# Patient Record
Sex: Female | Born: 1960 | Race: Black or African American | Hispanic: No | Marital: Single | Smoking: Never smoker
Health system: Southern US, Community
[De-identification: ages and names within clinical notes are randomized; demographics above are authoritative.]

---

## 2004-08-20 ENCOUNTER — Ambulatory Visit: Payer: Self-pay | Admitting: Obstetrics and Gynecology

## 2005-06-15 ENCOUNTER — Emergency Department: Payer: Self-pay | Admitting: Emergency Medicine

## 2005-08-22 ENCOUNTER — Ambulatory Visit: Payer: Self-pay | Admitting: Obstetrics and Gynecology

## 2006-09-09 ENCOUNTER — Ambulatory Visit: Payer: Self-pay | Admitting: Obstetrics and Gynecology

## 2008-09-21 ENCOUNTER — Ambulatory Visit: Payer: Self-pay | Admitting: Obstetrics and Gynecology

## 2008-10-12 ENCOUNTER — Ambulatory Visit: Payer: Self-pay | Admitting: Obstetrics and Gynecology

## 2010-05-23 ENCOUNTER — Ambulatory Visit: Payer: Self-pay | Admitting: Obstetrics and Gynecology

## 2010-06-15 ENCOUNTER — Ambulatory Visit: Payer: Self-pay | Admitting: Surgery

## 2010-06-22 ENCOUNTER — Ambulatory Visit: Payer: Self-pay | Admitting: Surgery

## 2010-06-26 LAB — PATHOLOGY REPORT

## 2010-09-11 ENCOUNTER — Ambulatory Visit: Payer: Self-pay | Admitting: Gastroenterology

## 2012-03-18 ENCOUNTER — Ambulatory Visit: Payer: Self-pay | Admitting: Obstetrics and Gynecology

## 2015-10-30 ENCOUNTER — Emergency Department (HOSPITAL_COMMUNITY)
Admission: EM | Admit: 2015-10-30 | Discharge: 2015-10-30 | Disposition: A | Discharge status: Home / Self Care | Payer: Self-pay

## 2015-11-02 ENCOUNTER — Other Ambulatory Visit: Payer: Self-pay | Admitting: Family Medicine

## 2015-11-02 ENCOUNTER — Other Ambulatory Visit: Payer: Self-pay

## 2015-11-02 DIAGNOSIS — R109 Unspecified abdominal pain: Secondary | ICD-10-CM

## 2015-11-03 ENCOUNTER — Other Ambulatory Visit: Payer: Self-pay

## 2015-11-03 ENCOUNTER — Ambulatory Visit
Admission: RE | Admit: 2015-11-03 | Discharge: 2015-11-03 | Disposition: A | Discharge status: Home / Self Care | Payer: Federal, State, Local not specified - PPO | Source: Ambulatory Visit | Attending: Family Medicine | Admitting: Family Medicine

## 2015-11-03 DIAGNOSIS — R109 Unspecified abdominal pain: Secondary | ICD-10-CM

## 2015-11-07 ENCOUNTER — Other Ambulatory Visit: Payer: Self-pay | Admitting: Family Medicine

## 2015-11-07 DIAGNOSIS — N2889 Other specified disorders of kidney and ureter: Secondary | ICD-10-CM

## 2015-11-10 ENCOUNTER — Ambulatory Visit
Admission: RE | Admit: 2015-11-10 | Discharge: 2015-11-10 | Disposition: A | Discharge status: Home / Self Care | Payer: Federal, State, Local not specified - PPO | Source: Ambulatory Visit | Attending: Family Medicine | Admitting: Family Medicine

## 2015-11-10 DIAGNOSIS — N2889 Other specified disorders of kidney and ureter: Secondary | ICD-10-CM

## 2016-01-04 DIAGNOSIS — M545 Low back pain: Secondary | ICD-10-CM | POA: Diagnosis not present

## 2016-01-17 DIAGNOSIS — M545 Low back pain: Secondary | ICD-10-CM | POA: Diagnosis not present

## 2016-03-12 DIAGNOSIS — M9902 Segmental and somatic dysfunction of thoracic region: Secondary | ICD-10-CM | POA: Diagnosis not present

## 2016-03-12 DIAGNOSIS — M9903 Segmental and somatic dysfunction of lumbar region: Secondary | ICD-10-CM | POA: Diagnosis not present

## 2016-03-12 DIAGNOSIS — M9901 Segmental and somatic dysfunction of cervical region: Secondary | ICD-10-CM | POA: Diagnosis not present

## 2016-03-12 DIAGNOSIS — M545 Low back pain: Secondary | ICD-10-CM | POA: Diagnosis not present

## 2016-03-13 DIAGNOSIS — M9901 Segmental and somatic dysfunction of cervical region: Secondary | ICD-10-CM | POA: Diagnosis not present

## 2016-03-13 DIAGNOSIS — M9903 Segmental and somatic dysfunction of lumbar region: Secondary | ICD-10-CM | POA: Diagnosis not present

## 2016-03-13 DIAGNOSIS — M9902 Segmental and somatic dysfunction of thoracic region: Secondary | ICD-10-CM | POA: Diagnosis not present

## 2016-03-13 DIAGNOSIS — M545 Low back pain: Secondary | ICD-10-CM | POA: Diagnosis not present

## 2016-09-25 DIAGNOSIS — Z803 Family history of malignant neoplasm of breast: Secondary | ICD-10-CM | POA: Diagnosis not present

## 2016-09-25 DIAGNOSIS — Z1231 Encounter for screening mammogram for malignant neoplasm of breast: Secondary | ICD-10-CM | POA: Diagnosis not present

## 2016-11-04 ENCOUNTER — Other Ambulatory Visit (HOSPITAL_COMMUNITY)
Admission: RE | Admit: 2016-11-04 | Discharge: 2016-11-04 | Disposition: A | Discharge status: Home / Self Care | Payer: Federal, State, Local not specified - PPO | Source: Ambulatory Visit | Attending: Family Medicine | Admitting: Family Medicine

## 2016-11-04 ENCOUNTER — Other Ambulatory Visit: Payer: Self-pay | Admitting: Family Medicine

## 2016-11-04 DIAGNOSIS — Z0000 Encounter for general adult medical examination without abnormal findings: Secondary | ICD-10-CM | POA: Diagnosis not present

## 2016-11-04 DIAGNOSIS — Z01411 Encounter for gynecological examination (general) (routine) with abnormal findings: Secondary | ICD-10-CM | POA: Insufficient documentation

## 2016-11-04 DIAGNOSIS — E782 Mixed hyperlipidemia: Secondary | ICD-10-CM | POA: Diagnosis not present

## 2016-11-04 DIAGNOSIS — E876 Hypokalemia: Secondary | ICD-10-CM | POA: Diagnosis not present

## 2016-11-04 DIAGNOSIS — R399 Unspecified symptoms and signs involving the genitourinary system: Secondary | ICD-10-CM | POA: Diagnosis not present

## 2016-11-04 DIAGNOSIS — Z1151 Encounter for screening for human papillomavirus (HPV): Secondary | ICD-10-CM | POA: Diagnosis not present

## 2016-11-04 DIAGNOSIS — D649 Anemia, unspecified: Secondary | ICD-10-CM | POA: Diagnosis not present

## 2016-11-04 DIAGNOSIS — E559 Vitamin D deficiency, unspecified: Secondary | ICD-10-CM | POA: Diagnosis not present

## 2016-11-06 LAB — CYTOLOGY - PAP
Diagnosis: NEGATIVE
HPV: NOT DETECTED

## 2017-11-05 DIAGNOSIS — D649 Anemia, unspecified: Secondary | ICD-10-CM | POA: Diagnosis not present

## 2017-11-05 DIAGNOSIS — E785 Hyperlipidemia, unspecified: Secondary | ICD-10-CM | POA: Diagnosis not present

## 2017-11-05 DIAGNOSIS — E559 Vitamin D deficiency, unspecified: Secondary | ICD-10-CM | POA: Diagnosis not present

## 2017-11-05 DIAGNOSIS — Z0000 Encounter for general adult medical examination without abnormal findings: Secondary | ICD-10-CM | POA: Diagnosis not present

## 2017-11-05 DIAGNOSIS — Z5181 Encounter for therapeutic drug level monitoring: Secondary | ICD-10-CM | POA: Diagnosis not present

## 2018-01-16 DIAGNOSIS — Z1231 Encounter for screening mammogram for malignant neoplasm of breast: Secondary | ICD-10-CM | POA: Diagnosis not present

## 2018-11-27 DIAGNOSIS — Z5181 Encounter for therapeutic drug level monitoring: Secondary | ICD-10-CM | POA: Diagnosis not present

## 2018-11-27 DIAGNOSIS — Z0000 Encounter for general adult medical examination without abnormal findings: Secondary | ICD-10-CM | POA: Diagnosis not present

## 2018-11-27 DIAGNOSIS — D649 Anemia, unspecified: Secondary | ICD-10-CM | POA: Diagnosis not present

## 2018-11-27 DIAGNOSIS — E559 Vitamin D deficiency, unspecified: Secondary | ICD-10-CM | POA: Diagnosis not present

## 2018-11-27 DIAGNOSIS — E785 Hyperlipidemia, unspecified: Secondary | ICD-10-CM | POA: Diagnosis not present

## 2018-12-03 ENCOUNTER — Other Ambulatory Visit: Payer: Self-pay | Admitting: Family Medicine

## 2018-12-03 DIAGNOSIS — R1084 Generalized abdominal pain: Secondary | ICD-10-CM

## 2018-12-09 ENCOUNTER — Ambulatory Visit
Admission: RE | Admit: 2018-12-09 | Discharge: 2018-12-09 | Disposition: A | Discharge status: Home / Self Care | Payer: Federal, State, Local not specified - PPO | Source: Ambulatory Visit | Attending: Family Medicine | Admitting: Family Medicine

## 2018-12-09 ENCOUNTER — Other Ambulatory Visit: Payer: Self-pay

## 2018-12-09 DIAGNOSIS — R1084 Generalized abdominal pain: Secondary | ICD-10-CM

## 2018-12-09 DIAGNOSIS — Z8249 Family history of ischemic heart disease and other diseases of the circulatory system: Secondary | ICD-10-CM | POA: Diagnosis not present

## 2019-04-02 DIAGNOSIS — Z803 Family history of malignant neoplasm of breast: Secondary | ICD-10-CM | POA: Diagnosis not present

## 2019-04-02 DIAGNOSIS — Z1231 Encounter for screening mammogram for malignant neoplasm of breast: Secondary | ICD-10-CM | POA: Diagnosis not present

## 2019-07-08 DIAGNOSIS — Z23 Encounter for immunization: Secondary | ICD-10-CM | POA: Diagnosis not present

## 2019-10-27 IMAGING — US ULTRASOUND AORTA
1 series · 13 of 13 positions shown · non-contrast
Comparison: None.

CLINICAL DATA: Family history of an abdominal aortic aneurysm.

EXAM:
ULTRASOUND OF ABDOMINAL AORTA
TECHNIQUE: Ultrasound examination of the abdominal aorta and proximal common
iliac arteries was performed to evaluate for aneurysm. Additional
color and Doppler images of the distal aorta were obtained to
document patency.

[Series 1: ultrasound aorta · 0.19mm/px · 13 of 13 slices shown]
[im 1/13]
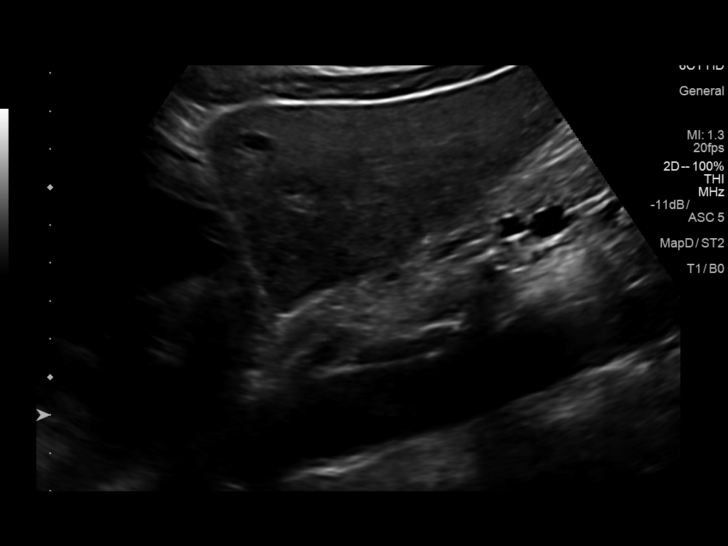
[im 2/13]
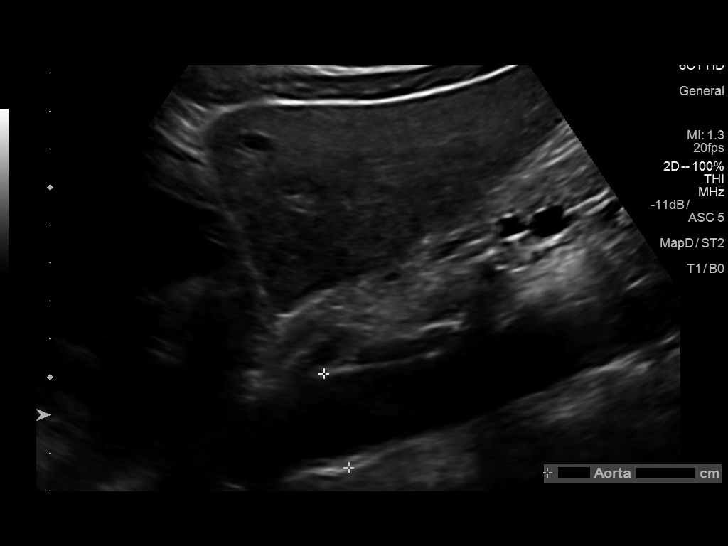
[im 3/13]
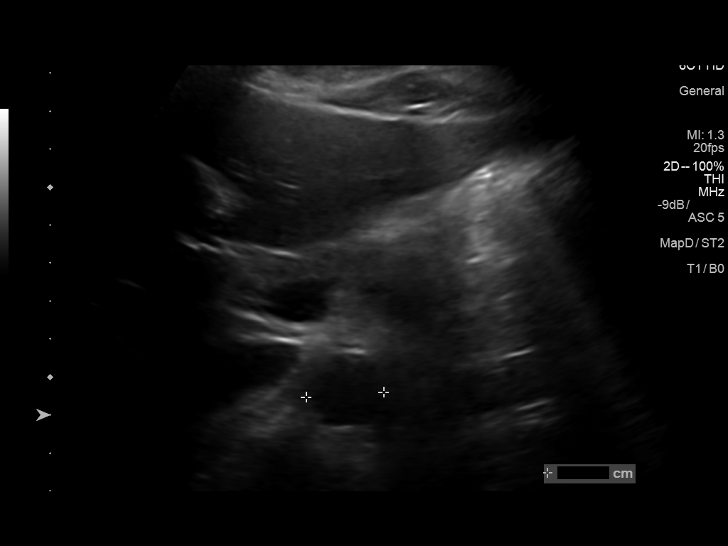
[im 4/13]
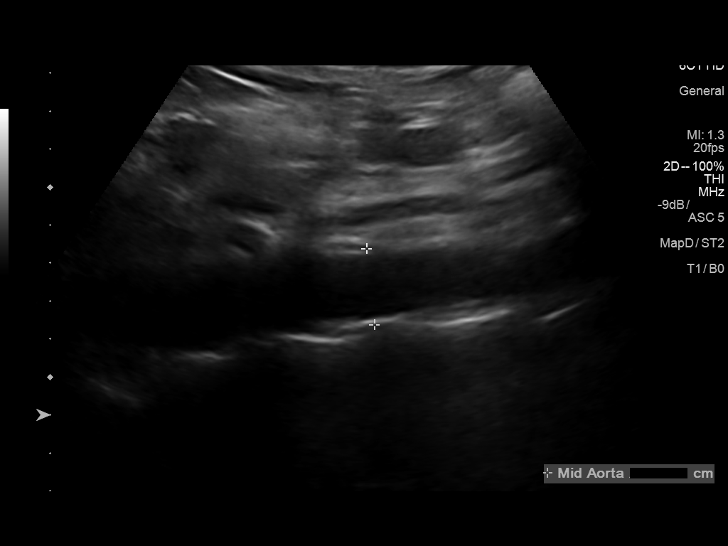
[im 5/13]
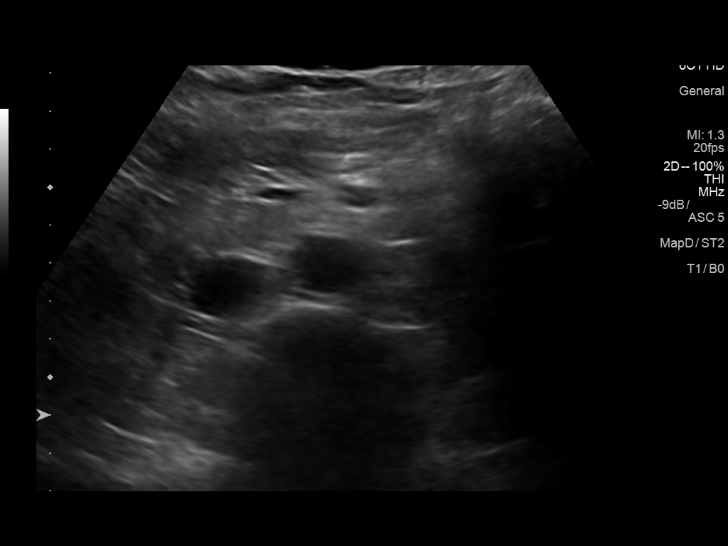
[im 6/13]
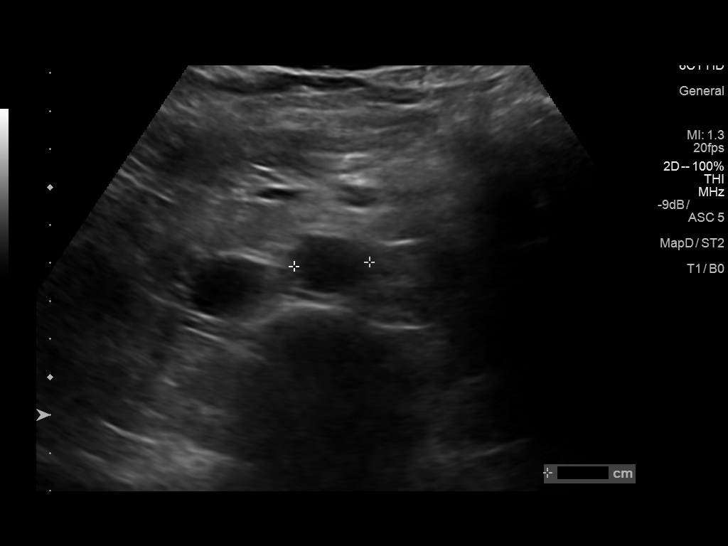
[im 7/13]
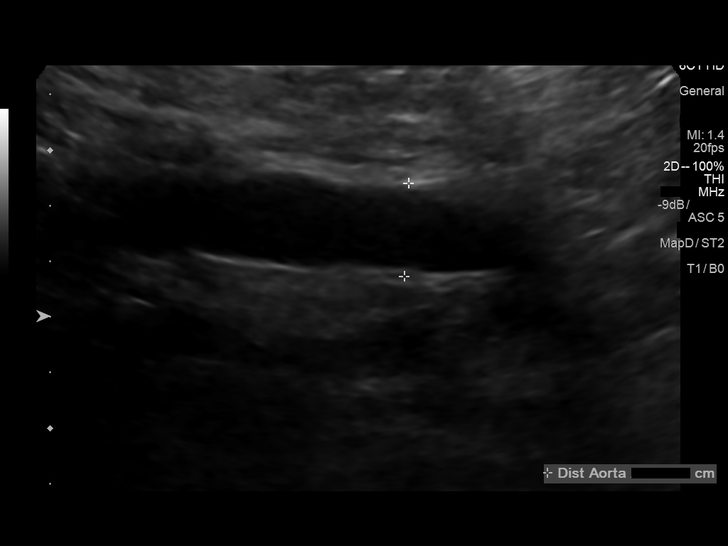
[im 8/13]
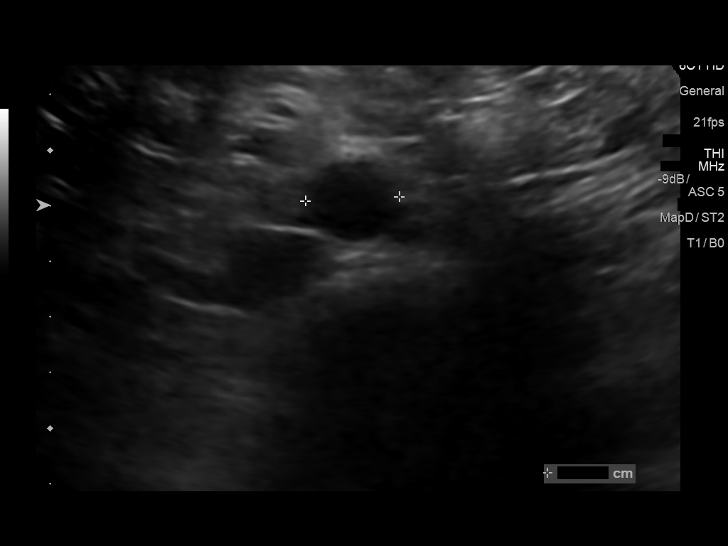
[im 9/13]
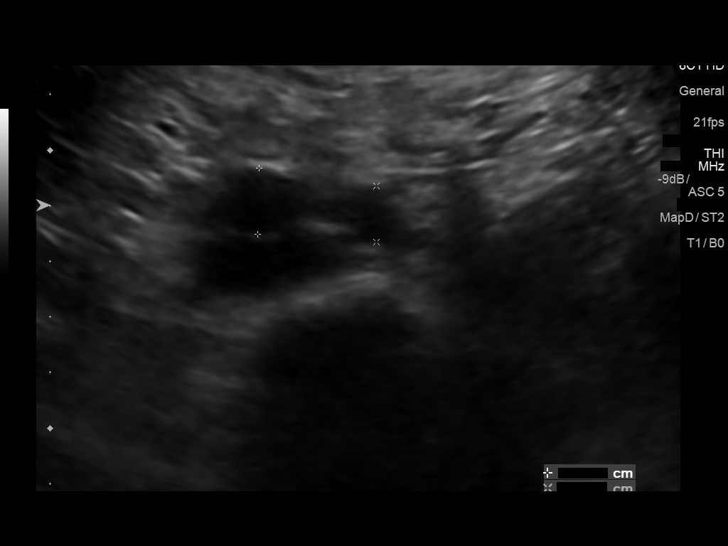
[im 10/13]
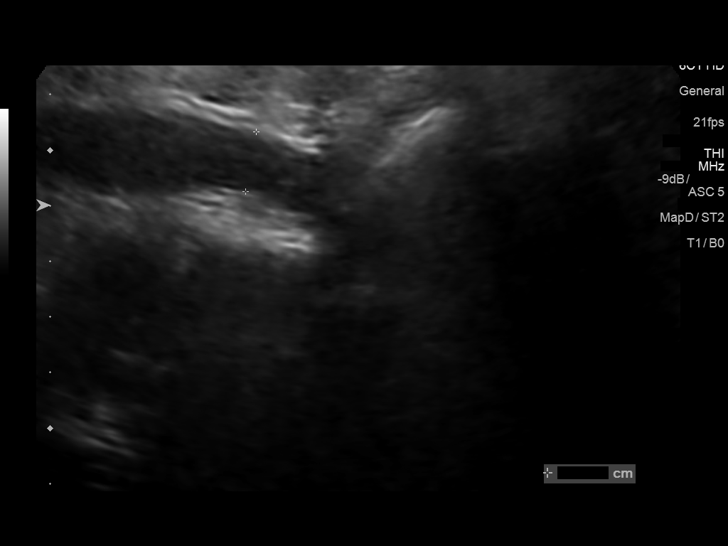
[im 11/13]
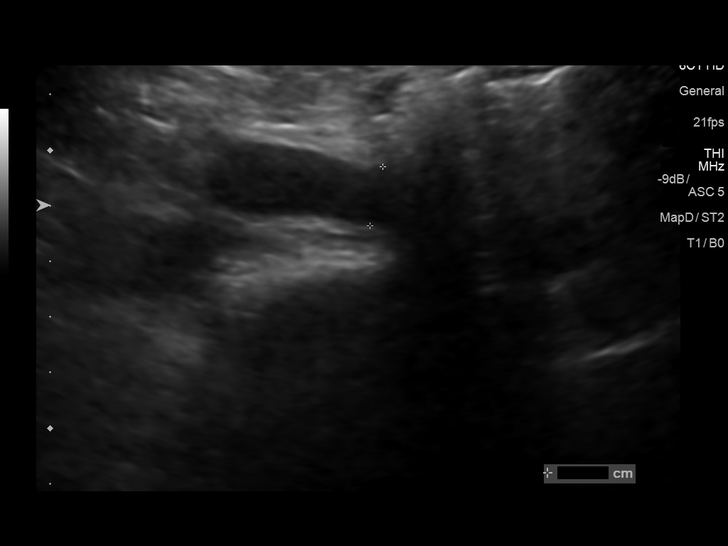
[im 12/13]
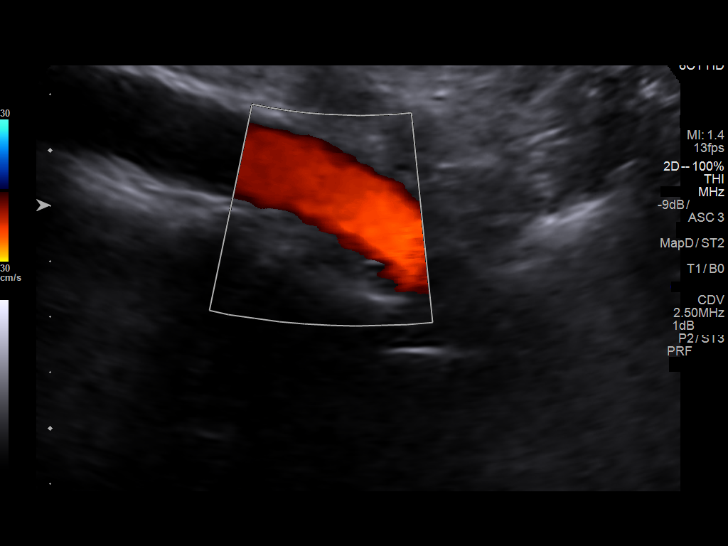
[im 13/13]
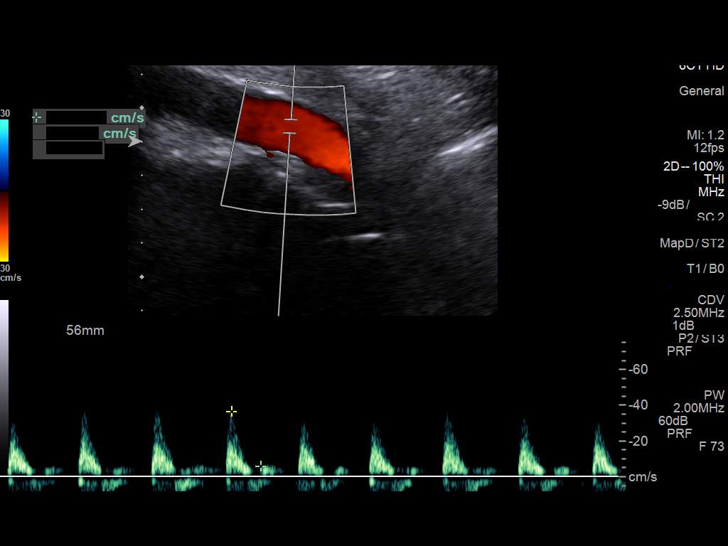

[13 of 13 positions shown; findings below may reference images not displayed]

FINDINGS: Abdominal aortic measurements as follows:

Proximal:  2.6 cm

Mid:  2.0 cm

Distal:  1.7 cm
Patent: Yes, peak systolic velocity is 36 cm/s

Right common iliac artery: 1.2 cm

Left common iliac artery: 1.1 cm
IMPRESSION: No evidence of abdominal aortic aneurysm.

## 2020-01-12 DIAGNOSIS — E559 Vitamin D deficiency, unspecified: Secondary | ICD-10-CM | POA: Diagnosis not present

## 2020-01-12 DIAGNOSIS — Z5181 Encounter for therapeutic drug level monitoring: Secondary | ICD-10-CM | POA: Diagnosis not present

## 2020-01-12 DIAGNOSIS — E785 Hyperlipidemia, unspecified: Secondary | ICD-10-CM | POA: Diagnosis not present

## 2020-01-12 DIAGNOSIS — Z0000 Encounter for general adult medical examination without abnormal findings: Secondary | ICD-10-CM | POA: Diagnosis not present

## 2020-04-07 DIAGNOSIS — Z1231 Encounter for screening mammogram for malignant neoplasm of breast: Secondary | ICD-10-CM | POA: Diagnosis not present

## 2020-04-11 DIAGNOSIS — K0263 Dental caries on smooth surface penetrating into pulp: Secondary | ICD-10-CM | POA: Diagnosis not present

## 2020-07-05 DIAGNOSIS — K080 Exfoliation of teeth due to systemic causes: Secondary | ICD-10-CM | POA: Diagnosis not present

## 2021-03-19 ENCOUNTER — Other Ambulatory Visit (HOSPITAL_COMMUNITY)
Admission: RE | Admit: 2021-03-19 | Discharge: 2021-03-19 | Disposition: A | Discharge status: Home / Self Care | Payer: 59 | Source: Ambulatory Visit | Attending: Family Medicine | Admitting: Family Medicine

## 2021-03-19 ENCOUNTER — Other Ambulatory Visit: Payer: Self-pay | Admitting: Family Medicine

## 2021-03-19 DIAGNOSIS — Z01411 Encounter for gynecological examination (general) (routine) with abnormal findings: Secondary | ICD-10-CM | POA: Insufficient documentation

## 2021-03-21 ENCOUNTER — Other Ambulatory Visit: Payer: Self-pay | Admitting: Family Medicine

## 2021-03-21 DIAGNOSIS — Z136 Encounter for screening for cardiovascular disorders: Secondary | ICD-10-CM

## 2021-03-21 LAB — CYTOLOGY - PAP
Comment: NEGATIVE
Diagnosis: NEGATIVE
Diagnosis: REACTIVE
High risk HPV: NEGATIVE

## 2021-04-17 ENCOUNTER — Ambulatory Visit
Admission: RE | Admit: 2021-04-17 | Discharge: 2021-04-17 | Disposition: A | Discharge status: Home / Self Care | Payer: 59 | Source: Ambulatory Visit | Attending: Family Medicine | Admitting: Family Medicine

## 2021-04-17 DIAGNOSIS — Z136 Encounter for screening for cardiovascular disorders: Secondary | ICD-10-CM

## 2022-03-01 IMAGING — US US AORTA
1 series · 14 of 25 positions shown · non-contrast
Comparison: 12/09/2018

CLINICAL DATA: Family history of abdominal aortic aneurysm.

EXAM:
ULTRASOUND OF ABDOMINAL AORTA
TECHNIQUE: Ultrasound examination of the abdominal aorta and proximal common
iliac arteries was performed to evaluate for aneurysm. Additional
color and Doppler images of the distal aorta were obtained to
document patency.

[Series 1: us aorta · 0.20mm/px · 14 of 25 slices shown]
[im 1/25]
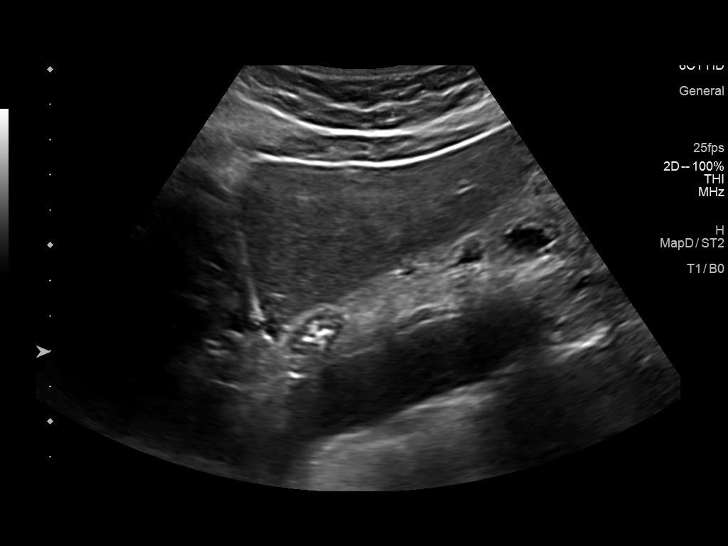
[im 3/25]
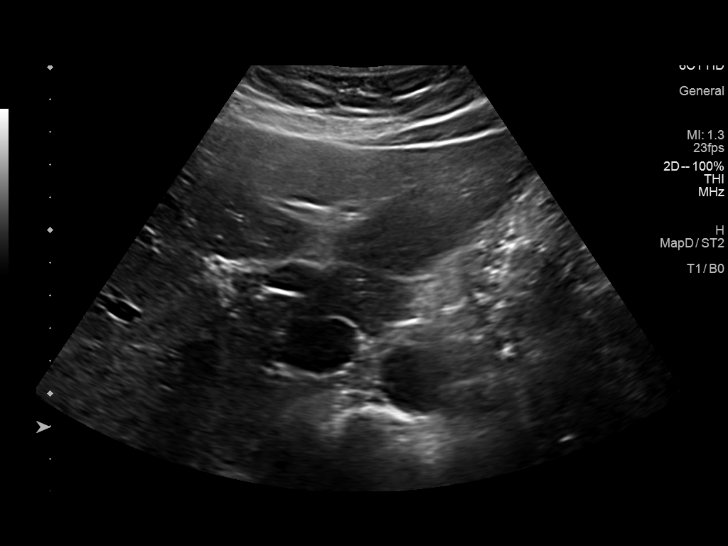
[im 5/25]
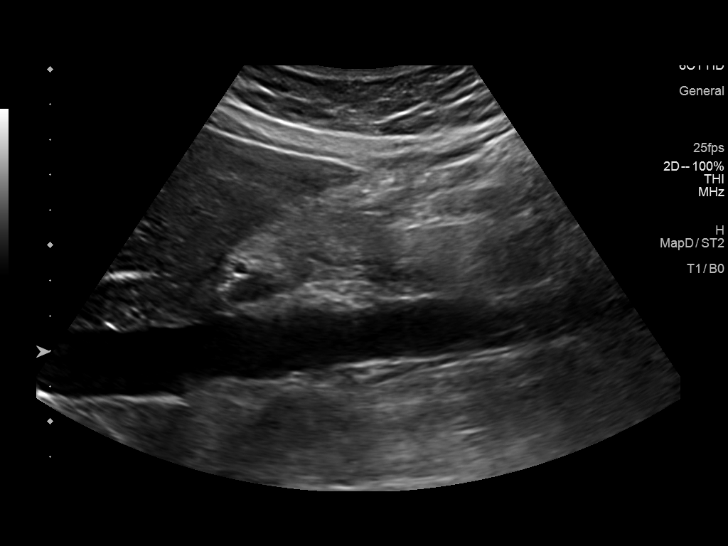
[im 7/25]
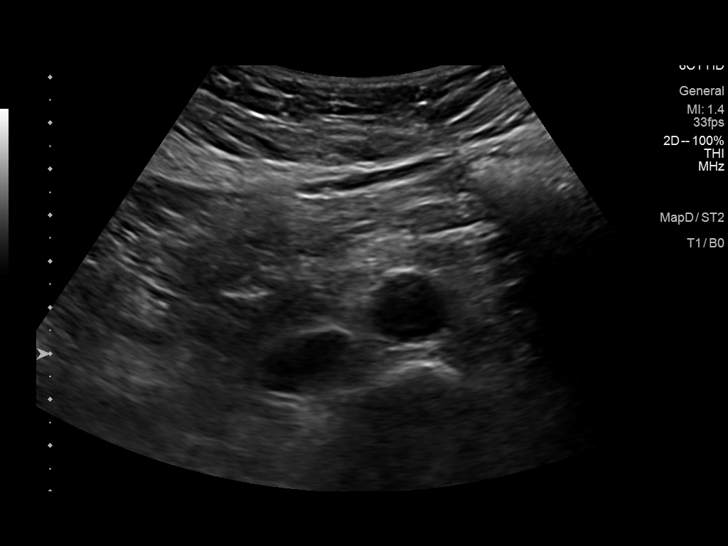
[im 9/25]
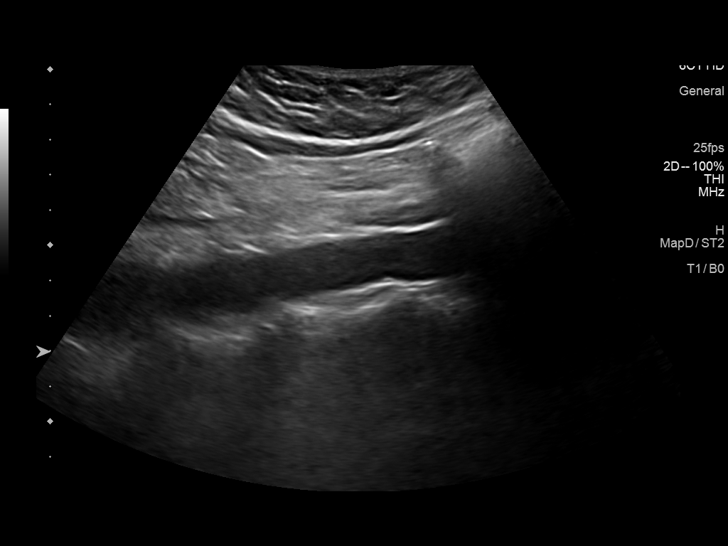
[im 10/25]
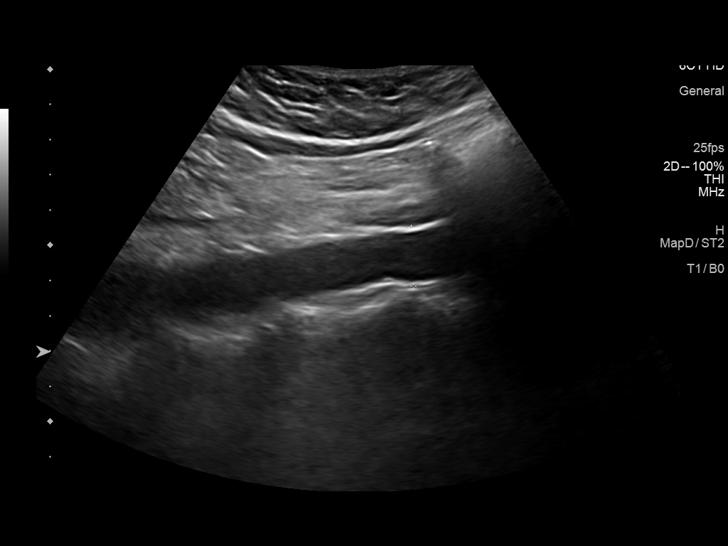
[im 12/25]
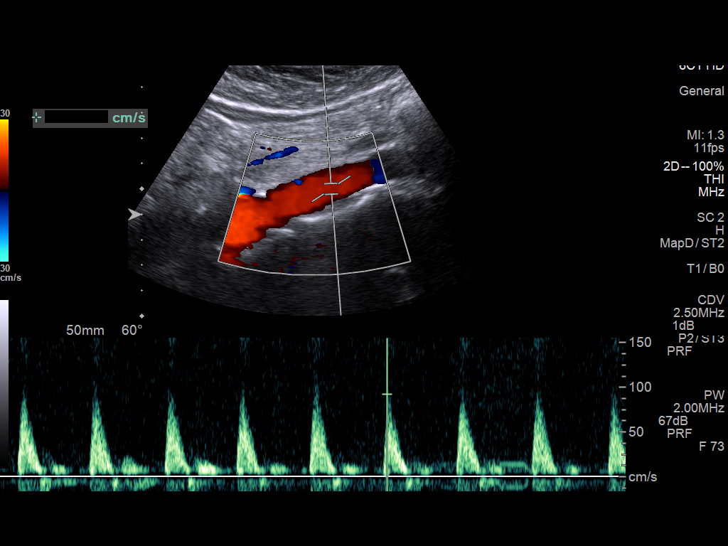
[im 14/25]
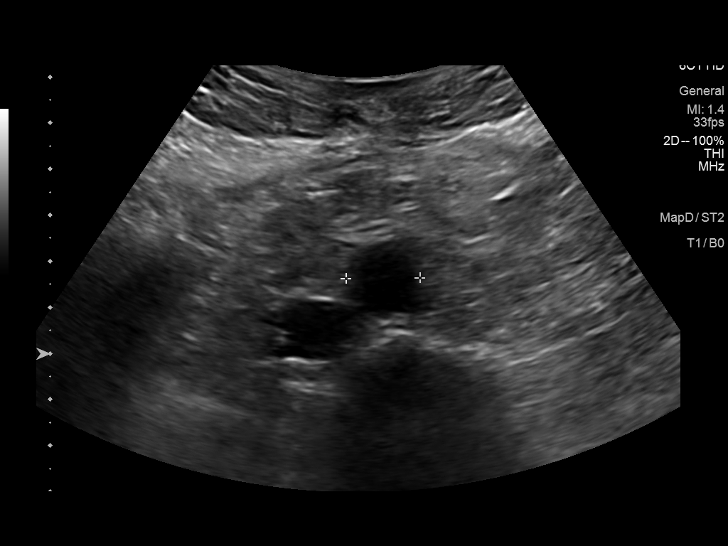
[im 16/25]
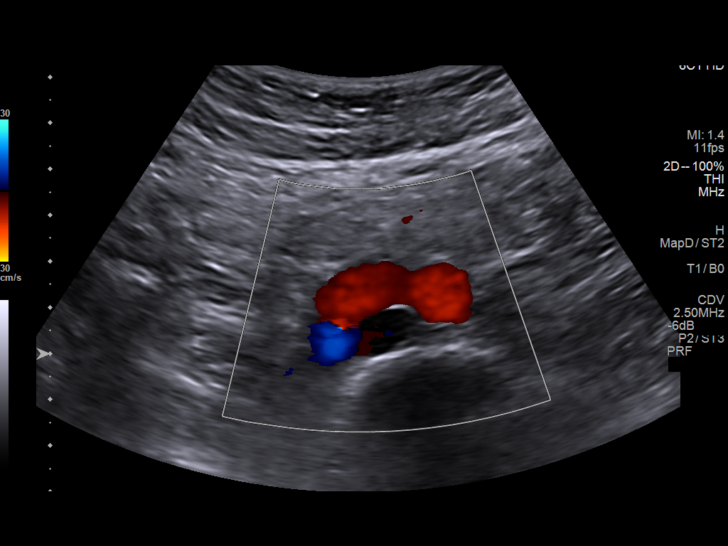
[im 17/25]
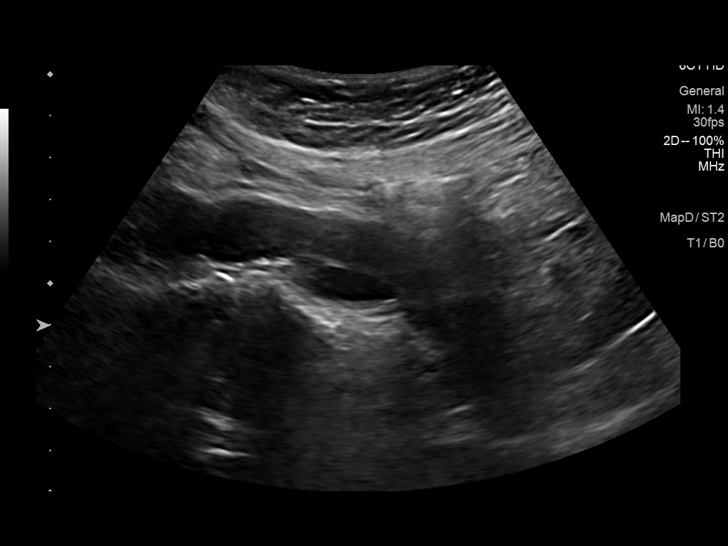
[im 19/25]
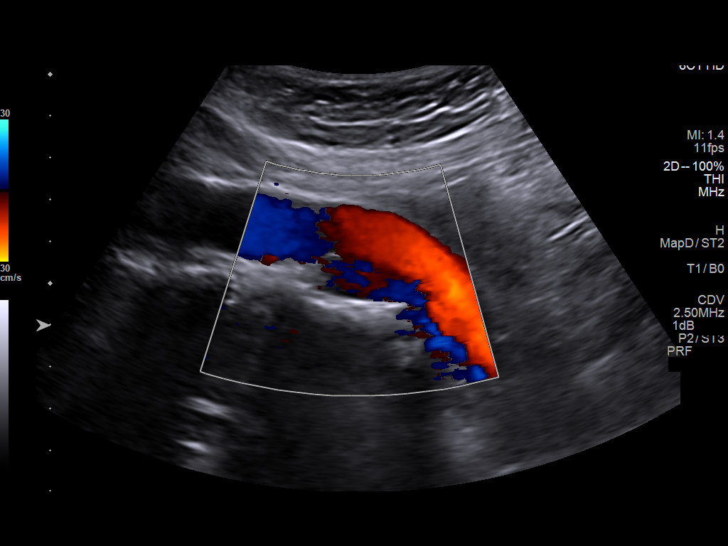
[im 21/25]
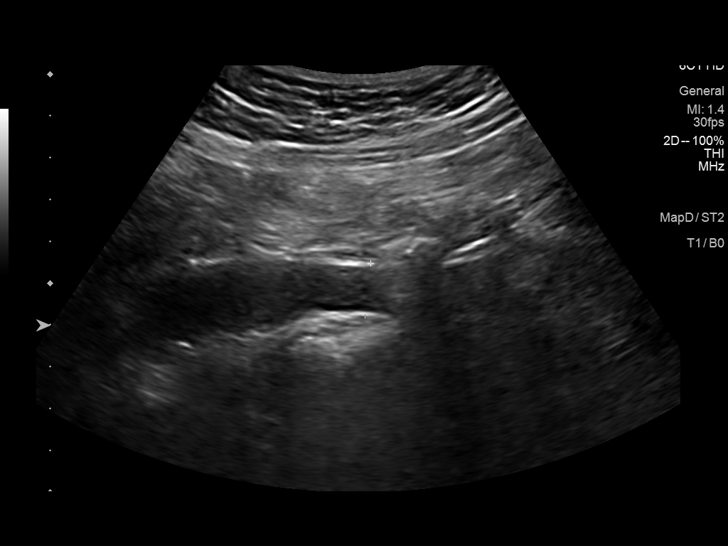
[im 23/25]
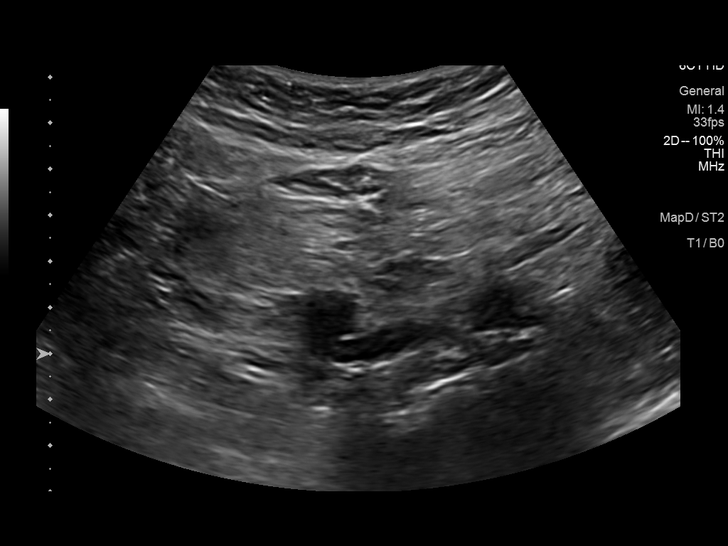
[im 25/25]
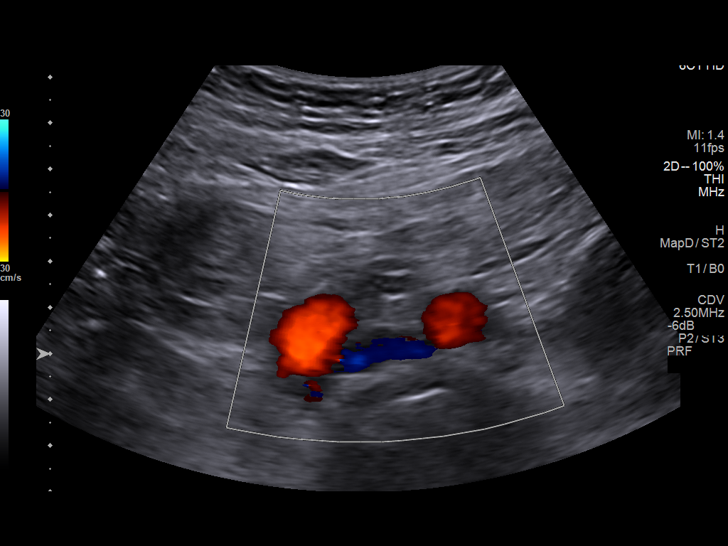

[14 of 25 positions shown; findings below may reference images not displayed]

FINDINGS: Abdominal aortic measurements as follows:

Proximal:  2.2 x 2.4 cm

Mid:  1.8 x 1.9 cm

Distal:  1.6 x 1.7 cm
Patent: Yes, peak systolic velocity is 92 cm/s

Right common iliac artery: 1.3 cm

Left common iliac artery: 1.3 cm

Mild atherosclerosis of the distal abdominal aorta.
IMPRESSION: No evidence of abdominal aortic aneurysm. Mild atherosclerosis
present.

## 2023-04-19 LAB — GLUCOSE, POCT (MANUAL RESULT ENTRY): Glucose Fasting, POC: 105 mg/dL — AB (ref 70–99)

## 2023-04-19 NOTE — Progress Notes (Signed)
Food and PCP resources given.

## 2023-05-07 ENCOUNTER — Encounter: Payer: Self-pay | Admitting: *Deleted

## 2023-05-07 NOTE — Progress Notes (Signed)
Pt attended 04/19/23 screening event where her b/p was 118/79 and her blood sugar was 105. At the event, pt did not note a PCP name and she was given PCP information; also pt did identify food insecurities for which she was given resources. Chart review documentation does not reveal any PCP encounters in [facility] over the past 12 months. During f/u phone call, pt shared her former PCP "left her practice" in 2023, and pt has not found anyone she wants to use as a PCP yet. Health equity team caller and pt discussed the advantages of establishing care with a PCP when pt is feeling well and not waiting until she has issues and then needs to look for healthcare. Pt verbalized understanding of need to find new PCP. She also confirmed she still has the [facility] PCP info given to her at the event and does not wish for any more info to be mailed to her at this time. She confirmed she has access to food and other SDOH needs at present and thanked the caller for her f/u and offers for support.

## 2023-07-09 ENCOUNTER — Encounter: Payer: Self-pay | Admitting: *Deleted

## 2023-07-09 NOTE — Progress Notes (Signed)
Pt attended 04/19/23 screening event where her b/p was 118/79 and her blood sugar was 105. At the event the pt identified a food insecurity for which she was given resources and noted she did not have a PCP, for which she was also given resources. During the 60 day post event f/u call today, pt noted she had indeed established care with Dr.Krishnamani Chinnasamy at [facility] in [facility] [address] location) whom she had just seen last week (Regina Juarez does not document in [facility] so encounter not visible on chart review). She had already had her annual physical and mammogram (in [facility] 07/03/[facility]) and was scheduled for her colonoscopy in November. She said her insurance was taken at [facility] with no co-pay so the PCP worked well for her and she did not currently have any SDOH needs. No additional health equity team support indicated at this time.
